# Patient Record
Sex: Female | Born: 1995 | Race: White | Hispanic: No | Marital: Single | Smoking: Never smoker
Health system: Southern US, Community
[De-identification: ages and names within clinical notes are randomized; demographics above are authoritative.]

## PROBLEM LIST (undated history)

## (undated) HISTORY — PX: TOTAL HIP ARTHROPLASTY: SHX124

---
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
## 2016-05-13 ENCOUNTER — Encounter (HOSPITAL_COMMUNITY): Payer: Self-pay | Admitting: Emergency Medicine

## 2016-05-13 ENCOUNTER — Ambulatory Visit (HOSPITAL_COMMUNITY)
Admission: EM | Admit: 2016-05-13 | Discharge: 2016-05-13 | Disposition: A | Discharge status: Home / Self Care | Payer: BC Managed Care – PPO

## 2016-05-13 DIAGNOSIS — J069 Acute upper respiratory infection, unspecified: Secondary | ICD-10-CM

## 2016-05-13 DIAGNOSIS — R05 Cough: Secondary | ICD-10-CM | POA: Diagnosis not present

## 2016-05-13 DIAGNOSIS — R110 Nausea: Secondary | ICD-10-CM | POA: Diagnosis not present

## 2016-05-13 DIAGNOSIS — R059 Cough, unspecified: Secondary | ICD-10-CM

## 2016-05-13 MED ORDER — IPRATROPIUM BROMIDE 0.06 % NA SOLN: 2.0000 | Freq: Four times a day (QID) | NASAL | 0 refills | Status: AC

## 2016-05-13 MED ORDER — BENZONATATE 100 MG PO CAPS: 100.0000 mg | ORAL_CAPSULE | Freq: Three times a day (TID) | ORAL | 0 refills | Status: AC

## 2016-05-13 MED ORDER — ONDANSETRON 4 MG PO TBDP: 4.0000 mg | ORAL_TABLET | Freq: Three times a day (TID) | ORAL | 0 refills | Status: AC | PRN

## 2016-05-13 NOTE — ED Provider Notes (Signed)
CSN: [id number]     Arrival date & time 05/13/16  1110 History   None    Chief Complaint  Patient presents with  . URI   (Consider location/radiation/quality/duration/timing/severity/associated sxs/prior Treatment) Patient c/o uri sx's and nausea for 4 days.  She is having cough and nasal drainage.   The history is provided by the patient.  URI  Presenting symptoms: congestion and sore throat   Severity:  Moderate Duration:  4 days Timing:  Constant Progression:  Partially resolved Chronicity:  New Relieved by:  Nothing Worsened by:  Nothing   History reviewed. No pertinent past medical history. No past surgical history on file. History reviewed. No pertinent family history. Social History  Substance Use Topics  . Smoking status: Not on file  . Smokeless tobacco: Not on file  . Alcohol use Not on file   OB History    No data available     Review of Systems  Constitutional: Negative.   HENT: Positive for congestion, postnasal drip and sore throat.   Eyes: Negative.   Respiratory: Negative.   Cardiovascular: Negative.   Gastrointestinal: Negative.   Endocrine: Negative.   Genitourinary: Negative.   Musculoskeletal: Negative.   Allergic/Immunologic: Negative.   Neurological: Negative.   Hematological: Negative.   Psychiatric/Behavioral: Negative.     Allergies  Patient has no known allergies.  Home Medications   Prior to Admission medications   Medication Sig Start Date End Date Taking? Authorizing Provider  Sertraline HCl (ZOLOFT PO) Take by mouth.   Yes Historical Provider, MD  benzonatate (TESSALON) 100 MG capsule Take 1 capsule (100 mg total) by mouth every 8 (eight) hours. 05/13/16   Deatra Canter, FNP  ipratropium (ATROVENT) 0.06 % nasal spray Place 2 sprays into both nostrils 4 (four) times daily. 05/13/16   Deatra Canter, FNP  ondansetron (ZOFRAN ODT) 4 MG disintegrating tablet Take 1 tablet (4 mg total) by mouth every 8 (eight) hours as needed for  nausea or vomiting. 05/13/16   Deatra Canter, FNP   Meds Ordered and Administered this Visit  Medications - No data to display  BP 111/72 (BP Location: Left Arm)   Pulse 83   Temp 98.2 F (36.8 C) (Oral)   Resp 16   LMP 04/29/2016   SpO2 99%  No data found.   Physical Exam  Constitutional: She appears well-developed and well-nourished.  HENT:  Head: Normocephalic and atraumatic.  Right Ear: External ear normal.  Left Ear: External ear normal.  Mouth/Throat: Oropharynx is clear and moist.  Eyes: Conjunctivae and EOM are normal. Pupils are equal, round, and reactive to light.  Neck: Normal range of motion. Neck supple.  Cardiovascular: Normal rate and regular rhythm.   Pulmonary/Chest: Effort normal and breath sounds normal.  Abdominal: Soft. Bowel sounds are normal.  Nursing note and vitals reviewed.   Urgent Care Course     Procedures (including critical care time)  Labs Review Labs Reviewed - No data to display  Imaging Review No results found.   Visual Acuity Review  Right Eye Distance:   Left Eye Distance:   Bilateral Distance:    Right Eye Near:   Left Eye Near:    Bilateral Near:         MDM   1. Upper respiratory tract infection, unspecified type   2. Nausea   3. Cough    Atrovent Nasal Spray 0.06% 2 sprays per nostril qid prn Tessalon Perles  Push po fluids, rest, tylenol and motrin  otc prn as directed for fever, arthralgias, and myalgias.  Follow up prn if sx's continue or persist.    Deatra CanterWilliam J Murat Rideout, FNP 05/13/16 1352

## 2016-05-13 NOTE — ED Triage Notes (Signed)
C/o cold sx States she has body ache, chills, nausea and diarrhea States she has some class mates with the flu otc meds taking as tx

## 2016-08-06 DIAGNOSIS — M1990 Unspecified osteoarthritis, unspecified site: Secondary | ICD-10-CM

## 2016-08-06 HISTORY — DX: Unspecified osteoarthritis, unspecified site: M19.90

## 2016-09-06 HISTORY — PX: HIP REPLACEMENT: SHX530A

## 2017-02-10 ENCOUNTER — Encounter (HOSPITAL_COMMUNITY): Payer: Self-pay | Admitting: Emergency Medicine

## 2017-02-10 ENCOUNTER — Ambulatory Visit (HOSPITAL_COMMUNITY)
Admission: EM | Admit: 2017-02-10 | Discharge: 2017-02-10 | Disposition: A | Discharge status: Home / Self Care | Payer: BC Managed Care – PPO | Attending: Emergency Medicine | Admitting: Emergency Medicine

## 2017-02-10 DIAGNOSIS — M7712 Lateral epicondylitis, left elbow: Secondary | ICD-10-CM | POA: Diagnosis not present

## 2017-02-10 DIAGNOSIS — R21 Rash and other nonspecific skin eruption: Secondary | ICD-10-CM | POA: Diagnosis not present

## 2017-02-10 MED ORDER — PREDNISONE 50 MG PO TABS: ORAL_TABLET | ORAL | 0 refills | Status: AC

## 2017-02-10 MED ORDER — ALBUTEROL SULFATE HFA 108 (90 BASE) MCG/ACT IN AERS: 2.0000 | INHALATION_SPRAY | RESPIRATORY_TRACT | 0 refills | Status: AC | PRN

## 2017-02-10 NOTE — Discharge Instructions (Signed)
Apply ice in the left elbow. Limited use for a few days. Uncertain as to what was causing the rash of the palms of your hands. This is the third person of seen in the past 3 days with same symptoms. Is possible that this may be a virus of some sort. If there is worsening or new symptoms may return or follow-up with primary care doctor.

## 2017-02-10 NOTE — ED Triage Notes (Signed)
Pt here for possible allergic reaction with red spots to hands x 3 days; pt sts started after taking augmentin

## 2017-02-10 NOTE — ED Provider Notes (Signed)
[facility]    CSN: [id number] Arrival date & time: 02/10/17  1725     History   Chief Complaint Chief Complaint  Patient presents with  . Allergic Reaction    HPI Jenna Riddle is a 21 y.o. female.   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 She also having pain in the left arm. She previously had some swelling in the arms. This weekend she had a dancing recital. It was worse at that time. She has finished a course of Augmentin a few days ago. There was a presumption that the rash may be due to the Augmentin. No generalized rash. Only the flat macular areas of the palms with some itching.      History reviewed. No pertinent past medical history.  There are no active problems to display for this patient.   Past Surgical History:  Procedure Laterality Date  . TOTAL HIP ARTHROPLASTY      OB History    No data available       Home Medications    Prior to Admission medications   Medication Sig Start Date End Date Taking? Authorizing Provider  albuterol (PROVENTIL HFA;VENTOLIN HFA) 108 (90 Base) MCG/ACT inhaler Inhale 2 puffs every 4 (four) hours as needed into the lungs for wheezing or shortness of breath. 02/10/17   Hayden Rasmussen, NP  benzonatate (TESSALON) 100 MG capsule Take 1 capsule (100 mg total) by mouth every 8 (eight) hours. 05/13/16   Deatra Canter, FNP  ipratropium (ATROVENT) 0.06 % nasal spray Place 2 sprays into both nostrils 4 (four) times daily. 05/13/16   Deatra Canter, FNP  ondansetron (ZOFRAN ODT) 4 MG disintegrating tablet Take 1 tablet (4 mg total) by mouth every 8 (eight) hours as needed for nausea or vomiting. 05/13/16   Deatra Canter, FNP  predniSONE (DELTASONE) 50 MG tablet 1 tab po daily for 6 days. Take with food. 02/10/17   Hayden Rasmussen, NP  Sertraline HCl (ZOLOFT PO) Take by mouth.    [provider]    Family History History reviewed. No pertinent family history.  Social  History Social History   Tobacco Use  . Smoking status: Never Smoker  . Smokeless tobacco: Never Used  Substance Use Topics  . Alcohol use: Yes    Comment: occ  . Drug use: No     Allergies   Patient has no known allergies.   Review of Systems Review of Systems  Constitutional: Negative.  Negative for activity change, chills and fever.  HENT: Negative.   Respiratory: Negative.   Cardiovascular: Negative.   Musculoskeletal:       As per HPI  Skin: Negative for color change, pallor and rash.  Neurological: Negative.   All other systems reviewed and are negative.    Physical Exam Triage Vital Signs ED Triage Vitals [02/10/17 1801]  Enc Vitals Group     BP 107/73     Pulse Rate 79     Resp 18     Temp 98.2 F (36.8 C)     Temp Source Oral     SpO2 99 %     Weight      Height      Head Circumference      Peak Flow      Pain Score 4     Pain Loc      Pain Edu?      Excl. in GC?  No data found.  Updated Vital Signs BP 107/73 (BP Location: Right Arm)   Pulse 79   Temp 98.2 F (36.8 C) (Oral)   Resp 18   SpO2 99%   Visual Acuity Right Eye Distance:   Left Eye Distance:   Bilateral Distance:    Right Eye Near:   Left Eye Near:    Bilateral Near:     Physical Exam  Constitutional: She is oriented to person, place, and time. She appears well-developed and well-nourished. No distress.  HENT:  Head: Normocephalic and atraumatic.  Eyes: EOM are normal.  Neck: Normal range of motion. Neck supple.  Cardiovascular: Normal rate.  Pulmonary/Chest: Effort normal. She has wheezes.  Musculoskeletal:  Direct tenderness over the left epicondyles. This produces pain that radiates into the forearm. Good strength in the wrist. Wrist extension against resistance produces pain in the forearm and lateral elbow. No swelling appreciated. Radial pulse 2+. Capillary refill brisk. Bilateral strength 5 over 5.  Neurological: She is alert and oriented to person, place, and  time. No cranial nerve deficit.  Skin: Skin is warm and dry.  Psychiatric: She has a normal mood and affect.  Nursing note and vitals reviewed.    UC Treatments / Results  Labs (all labs ordered are listed, but only abnormal results are displayed) Labs Reviewed - No data to display  EKG  EKG Interpretation None       Radiology No results found.  Procedures Procedures (including critical care time)  Medications Ordered in UC Medications - No data to display   Initial Impression / Assessment and Plan / UC Course  I have reviewed the triage vital signs and the nursing notes.  Pertinent labs & imaging results that were available during my care of the patient were reviewed by me and considered in my medical decision making (see chart for details).    Apply ice in the left elbow. Limited use for a few days. Uncertain as to what was causing the rash of the palms of your hands. This is the third person of seen in the past 3 days with same symptoms. Is possible that this may be a virus of some sort. If there is worsening or new symptoms may return or follow-up with primary care doctor.    Final Clinical Impressions(s) / UC Diagnoses   Final diagnoses:  Rash  Lateral epicondylitis of left elbow    ED Discharge Orders        Ordered    albuterol (PROVENTIL HFA;VENTOLIN HFA) 108 (90 Base) MCG/ACT inhaler  Every 4 hours PRN     02/10/17 2000    predniSONE (DELTASONE) 50 MG tablet     02/10/17 2000       Controlled Substance Prescriptions [place] Controlled Substance Registry consulted? Not Applicable   Hayden RasmussenMabe, Sunshine Mackowski, NP 02/10/17 2003

## 2017-02-10 NOTE — ED Notes (Signed)
Patient discharged by provider Hayden Rasmussenavid Mabe, PA

## 2017-05-13 ENCOUNTER — Other Ambulatory Visit: Payer: Self-pay | Admitting: Physician Assistant

## 2017-05-13 DIAGNOSIS — R221 Localized swelling, mass and lump, neck: Secondary | ICD-10-CM

## 2017-05-20 ENCOUNTER — Ambulatory Visit
Admission: RE | Admit: 2017-05-20 | Discharge: 2017-05-20 | Disposition: A | Discharge status: Home / Self Care | Payer: BC Managed Care – PPO | Source: Ambulatory Visit | Attending: Physician Assistant | Admitting: Physician Assistant

## 2017-05-20 DIAGNOSIS — R221 Localized swelling, mass and lump, neck: Secondary | ICD-10-CM

## 2017-10-27 LAB — UNMAPPED LAB RESULTS
Basophil # (HT): 0.1 10 3/uL — NL (ref 0.0–0.2)
Basophil % (HT): 0.7 % — NL (ref 0.0–1.8)
Eosinophil # (HT): 0.4 10 3/uL — NL (ref 0.0–0.5)
Eosinophil % (HT): 4.3 % — NL (ref 0.0–7.0)
Hematocrit (HT): 39.2 % — NL (ref 34.0–47.0)
Hemoglobin (HGB) (HT): 13.1 g/dL — NL (ref 11.5–16.0)
Lymphocyte # (HT): 2.6 10 3/uL — NL (ref 0.9–3.8)
Lymphocyte % (HT): 29.4 % — NL (ref 17.0–44.0)
MCHC (HT): 33.4 g/dL — NL (ref 32.0–36.0)
MCV (HT): 90.5 FL — NL (ref 81.0–99.0)
Mean Corpuscular Hemoglobin (MCH) (HT): 30.3 pg — NL (ref 26.0–34.0)
Monocyte # (HT): 0.5 10 3/uL — NL (ref 0.2–1.0)
Monocyte % (HT): 5.8 % — NL (ref 4.0–12.0)
Neutrophil # (HT): 5.3 10 3/uL — NL (ref 1.5–7.7)
Platelets (HT): 455 10 3/uL — ABNORMAL HIGH (ref 140–400)
RBC (HT): 4.33 10 6/uL — NL (ref 3.80–5.20)
RDW (HT): 11.9 % — NL (ref 11.5–15.0)
Seg Neut % (HT): 59.8 % — NL (ref 40.0–75.0)
WBC (HT): 8.9 10 3/uL — NL (ref 4.0–10.8)

## 2017-11-24 LAB — UNMAPPED LAB RESULTS
Basophil # (HT): 0.1 10 3/uL — NL (ref 0.0–0.2)
Basophil % (HT): 0.9 % — NL (ref 0.0–1.8)
Eosinophil # (HT): 0.3 10 3/uL — NL (ref 0.0–0.5)
Eosinophil % (HT): 4.5 % — NL (ref 0.0–7.0)
Hematocrit (HT): 38.9 % — NL (ref 34.0–47.0)
Hemoglobin (HGB) (HT): 13.2 g/dL — NL (ref 11.5–16.0)
Lymphocyte # (HT): 3 10 3/uL — NL (ref 0.9–3.8)
Lymphocyte % (HT): 44.9 % — ABNORMAL HIGH (ref 17.0–44.0)
MCHC (HT): 33.9 g/dL — NL (ref 32.0–36.0)
MCV (HT): 89.2 FL — NL (ref 81.0–99.0)
Mean Corpuscular Hemoglobin (MCH) (HT): 30.3 pg — NL (ref 26.0–34.0)
Monocyte # (HT): 0.4 10 3/uL — NL (ref 0.2–1.0)
Monocyte % (HT): 5.8 % — NL (ref 4.0–12.0)
Neutrophil # (HT): 2.9 10 3/uL — NL (ref 1.5–7.7)
Platelets (HT): 351 10 3/uL — NL (ref 140–400)
RBC (HT): 4.36 10 6/uL — NL (ref 3.80–5.20)
RDW (HT): 12 % — NL (ref 11.5–15.0)
Seg Neut % (HT): 43.9 % — NL (ref 40.0–75.0)
WBC (HT): 6.7 10 3/uL — NL (ref 4.0–10.8)

## 2018-01-14 LAB — UNMAPPED LAB RESULTS
Basophil # (HT): 0.1 10 3/uL — NL (ref 0.0–0.2)
Basophil % (HT): 0.8 % — NL (ref 0.0–1.8)
Eosinophil # (HT): 0.2 10 3/uL — NL (ref 0.0–0.5)
Eosinophil % (HT): 2.5 % — NL (ref 0.0–7.0)
Hematocrit (HT): 41.3 % — NL (ref 34.0–47.0)
Hemoglobin (HGB) (HT): 14 g/dL — NL (ref 11.5–16.0)
Lymphocyte # (HT): 2.6 10 3/uL — NL (ref 0.9–3.8)
Lymphocyte % (HT): 34.9 % — NL (ref 17.0–44.0)
MCHC (HT): 33.9 g/dL — NL (ref 32.0–36.0)
MCV (HT): 89.8 FL — NL (ref 81.0–99.0)
Mean Corpuscular Hemoglobin (MCH) (HT): 30.4 pg — NL (ref 26.0–34.0)
Monocyte # (HT): 0.5 10 3/uL — NL (ref 0.2–1.0)
Monocyte % (HT): 6.6 % — NL (ref 4.0–12.0)
Neutrophil # (HT): 4.2 10 3/uL — NL (ref 1.5–7.7)
Platelets (HT): 392 10 3/uL — NL (ref 140–400)
RBC (HT): 4.6 10 6/uL — NL (ref 3.80–5.20)
RDW (HT): 11.9 % — NL (ref 11.5–15.0)
Seg Neut % (HT): 55.2 % — NL (ref 40.0–75.0)
WBC (HT): 7.6 10 3/uL — NL (ref 4.0–10.8)

## 2018-02-24 ENCOUNTER — Ambulatory Visit: Payer: Self-pay | Admitting: Orthopedic Surgery

## 2018-02-27 ENCOUNTER — Ambulatory Visit
Admission: RE | Admit: 2018-02-27 | Discharge: 2018-02-27 | Disposition: A | Discharge status: Home / Self Care | Payer: BLUE CROSS/BLUE SHIELD | Source: Ambulatory Visit | Attending: Orthopedic Surgery | Admitting: Orthopedic Surgery

## 2018-02-27 ENCOUNTER — Ambulatory Visit: Payer: BLUE CROSS/BLUE SHIELD | Admitting: Orthopedic Surgery

## 2018-02-27 ENCOUNTER — Other Ambulatory Visit: Payer: Self-pay | Admitting: Orthopedic Surgery

## 2018-02-27 VITALS — BP 119/72 | HR 130

## 2018-02-27 DIAGNOSIS — M25551 Pain in right hip: Secondary | ICD-10-CM

## 2018-02-27 DIAGNOSIS — Z96641 Presence of right artificial hip joint: Secondary | ICD-10-CM

## 2018-02-27 NOTE — Patient Instructions (Signed)
Active Release Technique (ART):  http://www.activerelease.com/find-a-provider.asp    Patient's Guide to Arthroscopy: [email or web address]_PATIENT_[email or web address]    Phone: [phone]  Fax: [phone]  Email: [email or web address]  Web site: [email or web address]

## 2018-02-27 NOTE — Progress Notes (Signed)
Diagnosis and Code:     ICD-10-CM ICD-9-CM   1. Pain of right hip joint M25.551 719.45       CPT Code:     Hip   _0  Right      _1  Left        _2  Diagnostic Arthroscopy, 80998        _3  Removal of Loose Bodies, 33825        _4  Chondroplasty, 05397        _5  Synovectomy, 67341        _6  Femoroplasty, 93790        _7  Acetabuloplasty, 24097        _8  Labral Repair, 29916         _9  Trochanteric Bursectomy - open, 27062        _10   Trochanteric Bursectomy- endoscopic 29999        _11   Gluteus Medius  Repair - endoscopic 35329         _12  Gluteus Medius Repair - open, 27110        _13  IT Band Release, 27305        _14  Abductor Tenotomy,  27001        _15  Iliopsoas Lengthening 29999        _16  Sciatic Neurolysis,  64712        _17  Proximal Hamstring Repair,  [id number]         _18  Staged Scope PAO,  xxx        _19  Revision scope,  xxx         _20  Other: 29999     Shoulder     _21  Right      _22  Left        _23  Rotator Cuff Repair Arthroscopy, [id number]    _24  possible             _25  Arthroscopic Debridement,  92426         _26  Subacromial Decompression/Acromioplasty Arthroscopy, [id number]         _27  Distal Clavicle Excision Arthroscopy, 83419         _28  Arthroscopic Bankart Repair, 62229         _29  Labral/SLAP Repair Arthroscopy, [phone]         _30  Synovectomy Arthroscopy, (full: 29821 partial:29822),               _31  Bicep Tenodesis/Tenotomy, 23430      _32  Bicep Tenodesis 11941            _33  Other:       Knee     _34  Right      _35  Left        _36  Knee Arthroscopy, 29870        _37  A'scopic menisectomy vs. repair, 74081        _38  A'scopic chondroplasty, 44818         _39  Synovectomy Arthroscopy, (full: 29876 partial:29875)        _40  Lateral Release Arthroscopy, 56314        _41  Loose Body removal Arthroscopy, 97026        _42  Patella repair, 37858        _43  Quad repair,  [id number]        _44  Other:    Elbow    _45  Right      _46  Left        _47  Distal bicep repair, 85027        _48  Debridement of the Common Extensor,  Open, [id number]         _0  Debridement of the  medial epicondyle, 24359          _1  Other:        Anesthesia: _2  Choice _3  General _4  MAC _5  Brachial Plexus Block    _6  IA  _7  Pre op fem Block  _8  POST op fem Block    _9  Regional    Position: _10  Lateral Decubitus _11  Supine    Admit Type:  _12  ASC  _13  Inpatient/SDA _14  23hr      Length of Procedure:  _15  30 min _16  1 hr  _17  1.5 hr  _18 2 hr    _19  other:      Medical Clearance:   _20  PCP  _21  Cardiology _22  Rheum _23  Other:   _24  Anesthesia consult    Special Equipment:   _25  C-arm     _26  Hand table      _27  Other:          Insurance:  _28  WC  _29  MVA    Biologics:        _30  Angel PRP        _31  Stem Cell ([place])        Therapy Preferences:  _32  [facility]   _33  [facility]     _34  [facility]   _35  [facility]  _36  [facility] [place]   _37  [place]   _38  [place]   _39  [place]   _40  [place]   _41  [facility]    _42  [place]   _43  [place]   _44  [place]     _45  [facility]  _46  [place] [facility]  _47  [facility]    _48  [place]       _49  [place]       _50  [place]   _51  [place]      _52  Other:   _53  Other:             COMMENTS:

## 2018-02-27 NOTE — Provider Consult (Signed)
CONSULTATION    PATIENT:   Leslie Myers, Leslie Myers  DOB:  04-12-1995   CONSULT DATE:  02/27/2018 PCP:     AGE:  22     CHIEF COMPLAINT:  Chronic right hip pain.    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 here today with a complex musculoskeletal history.  She developed onset and progression of hip pain in 2018.  Over the course of 3 to 4 weeks and after physical therapy failed to provide durable symptomatic improvement and a period of relative rest did not resolve her symptoms, she visited with a physician in the [place] area.  Imaging was obtained.  It was at first questionable for a labral pathology.  An attempt was made at a diagnostic intra-articular lidocaine injection which failed.  No specific labral pathology was present.  Although there was a question of whether a posterior labral tear was present.  A followup image-guided intra-articular diagnostic lidocaine injection gave notable improvement with post-injection functional testing.  Surgeon involved was Duwayne Heck.  Dr. Aundria Rud elected to perform right hip arthroscopy on March 19, 2017.  The surgery took about 1 and 1/2 hours.  Intraoperative images were available from that surgery.  Patient started physical therapy about 2 to 3 days following surgery, and she was progressing well for the first few months.  On return to some light modified dance at 6 to 8 weeks she noted increasing pain, and by 3 months was quite debilitated.  She visited with Dr. Wallene Huh at [facility] and the updated imaging showed end-stage arthritis.  No further arthroscopic intervention was felt to be indicated and therefore, she was converted to a total hip arthroplasty within 6 months of her hip arthroscopy.  She was able to return to dance and transition to graduate studies at [employer], but has continued to experience constant right hip pain and snapping ever since.  She is here to today, so that we may provide additional  opinions concerning whether she is a candidate for iliopsoas lengthening after hip arthroplasty.  She has visited with Dr. Sudie Bailey, who felt that her components were well fixed and there was no evidence of arthroplasty-related complications with the exception of psoas irritation.  She did have a guided psoas injection which gave 1 week symptomatic relief.     Details of past medical, surgical history, medications, allergies, social history, review of systems, family history are available per the intake questionnaire, 02/27/2018.    PHYSICAL EXAMINATION:  GENERAL:  Well-appearing woman in no acute distress.  She is alert and oriented x3, with a pleasant mood and affect.  HEENT:  She is normocephalic, atraumatic.  Extraocular muscles are intact.  NECK:  Cervical spine exhibits pain-free mobility.  No upper motor neuron signs, cervical myelopathy.  Negative Spurling's and head compression test.  UPPER EXTREMITIES:  Bilateral upper extremities show full active and passive range of motion.  No objective instability.  Negative apprehension, relocation.  Distal pulses intact both upper and lower extremities.  No abrasions, lacerations, lesions.  Sensation intact C5, T1, L2, S1.  No lymphadenopathy.  Deep tendon reflexes 2+.  ABDOMEN:  Soft, nontender with no rebound tenderness or peritoneal signs.  PELVIS:  Stable to AP and lateral compression.  No pain over the pelvic brim, pubic symphysis, ASIS.  LOWER EXTREMITIES:  Right hip range of motion is 100 degrees of flexion with discomfort and internal rotation is to 20, and external rotation is to 80.  Left hip range of motion is 130 flexion with internal rotation to 80, and external rotation to 90.  Negative FADIR.  Negative FABER.  Negative log roll.  Negative straight leg raise with respect to the left hip.  She has positive FADIR and FABER, positive log roll, and negative straight leg raise.  She has reproducible snapping of iliopsoas tendon provocative maneuvering  on the right hip.  She has a positive Thomas test.  She has a variety of arthroscopy portals in close proximity and a large hypertrophic scar from an anterior approach total hip replacement.    DATA REVIEWED:  Her imaging available for review today documents well fixed components.  X-rays reveal well-fixed components, appropriate version, alignment, and orientation.  No obvious periprosthetic complications.    ASSESSMENT AND PLAN:  A 22 year old longstanding right hip pain with index hip arthroscopy complicated by progressive osteoarthritis and the need for hip arthroplasty with subsequent iliopsoas abrasion.     Leslie Myers has exhausted conservative care, physical therapy, activity modification, rest, and anti-inflammatories to address her symptomatic iliopsoas tendinitis, and had a diagnostic image-guided injection which gave temporary symptomatic relief.  We discussed the biomechanical property to the iliopsoas as a secondary stabilizer and also as a powerful hip flexor.  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  In this particular setting, I believe it is reasonable to consider endoscopic iliopsoas lengthening with debridement.  Risks, benefits, alternatives, and reasonable expectations of surgery were discussed.  Prognosis, rate of rehabilitation and recovery were reviewed.  Questions were invited and answered to her satisfaction.  She will weigh her options and set up surgery at a time of her convenience and I will separately discuss this case with Dr. Caswell CorwinStubbs.             ______________________________  Arvil PersonsBrian D Yacqub Baston, MD    BDG/MODL  DD:  02/27/2018 13:36:00  DT:  02/27/2018 14:46:42  Job #:  [id number]    cc:

## 2018-04-08 DIAGNOSIS — R002 Palpitations: Secondary | ICD-10-CM

## 2018-04-08 HISTORY — DX: Palpitations: R00.2

## 2018-05-04 LAB — UNMAPPED LAB RESULTS
Basophil # (HT): 0 10 3/uL — NL (ref 0.0–0.2)
Basophil % (HT): 0.7 % — NL (ref 0.0–1.8)
Eosinophil # (HT): 0.2 10 3/uL — NL (ref 0.0–0.5)
Eosinophil % (HT): 3.2 % — NL (ref 0.0–7.0)
Hematocrit (HT): 39.6 % — NL (ref 34.0–47.0)
Hemoglobin (HGB) (HT): 13.4 g/dL — NL (ref 11.5–16.0)
Lymphocyte # (HT): 2.2 10 3/uL — NL (ref 0.9–3.8)
Lymphocyte % (HT): 39.8 % — NL (ref 17.0–44.0)
MCHC (HT): 33.8 g/dL — NL (ref 32.0–36.0)
MCV (HT): 89.4 FL — NL (ref 81.0–99.0)
Mean Corpuscular Hemoglobin (MCH) (HT): 30.2 pg — NL (ref 26.0–34.0)
Monocyte # (HT): 0.6 10 3/uL — NL (ref 0.2–1.0)
Monocyte % (HT): 10.3 % — NL (ref 4.0–12.0)
Neutrophil # (HT): 2.6 10 3/uL — NL (ref 1.5–7.7)
Platelets (HT): 335 10 3/uL — NL (ref 140–400)
RBC (HT): 4.43 10 6/uL — NL (ref 3.80–5.20)
RDW (HT): 11.8 % — NL (ref 11.5–15.0)
Seg Neut % (HT): 46 % — NL (ref 40.0–75.0)
WBC (HT): 5.6 10 3/uL — NL (ref 4.0–10.8)

## 2018-05-13 ENCOUNTER — Telehealth: Payer: BLUE CROSS/BLUE SHIELD

## 2018-05-13 DIAGNOSIS — M25559 Pain in unspecified hip: Secondary | ICD-10-CM

## 2018-05-13 NOTE — Telephone Encounter (Signed)
Leslie Myers is asking for a script for PT - Left hip    Last seen in Nov - she would like to schedule at [facility] [phone]

## 2018-09-11 LAB — UNMAPPED LAB RESULTS
Hematocrit (HT): 43.4 % — NL (ref 34.0–47.0)
Hemoglobin (HGB) (HT): 14.6 g/dL — NL (ref 11.5–16.0)
MCHC (HT): 33.6 g/dL — NL (ref 32.0–36.0)
MCV (HT): 89.5 FL — NL (ref 81.0–99.0)
Mean Corpuscular Hemoglobin (MCH) (HT): 30.1 pg — NL (ref 26.0–34.0)
Platelets (HT): 419 10 3/uL — ABNORMAL HIGH (ref 140–400)
RBC (HT): 4.85 10 6/uL — NL (ref 3.80–5.20)
RDW (HT): 12.1 % — NL (ref 11.5–15.0)
WBC (HT): 6.4 10 3/uL — NL (ref 4.0–10.8)

## 2019-02-10 IMAGING — US US THYROID
1 series · 14 of 25 positions shown · non-contrast
Comparison: None.

CLINICAL DATA: Goiter. Right thyroid enlargement and difficulty
swallowing.

EXAM:
THYROID ULTRASOUND
TECHNIQUE: Ultrasound examination of the thyroid gland and adjacent soft
tissues was performed.

[Series 1: us thyroid · 0.05mm/px · 14 of 42 slices shown]
[im 1/42]
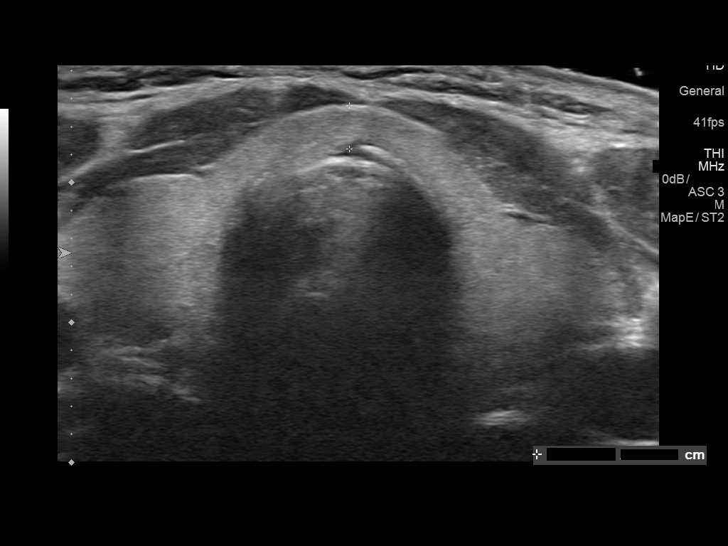
[im 4/42]
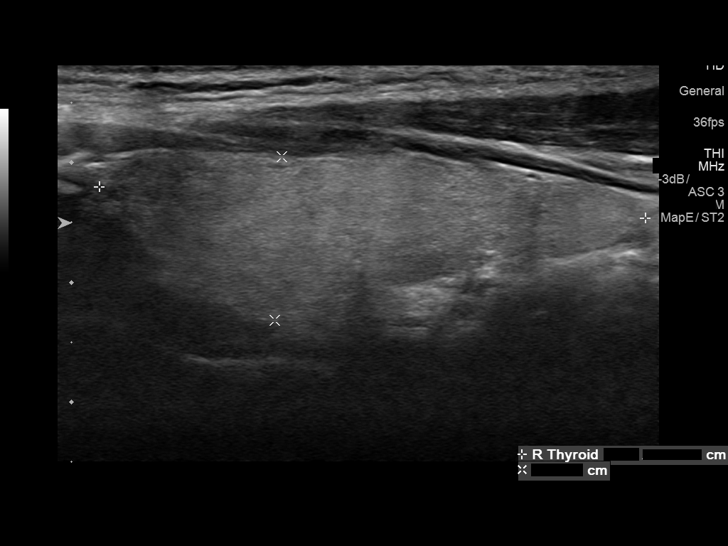
[im 7/42]
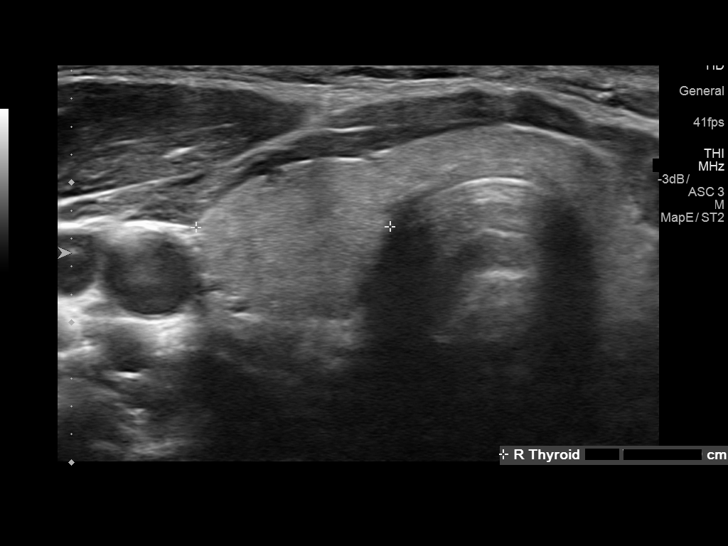
[im 11/42]
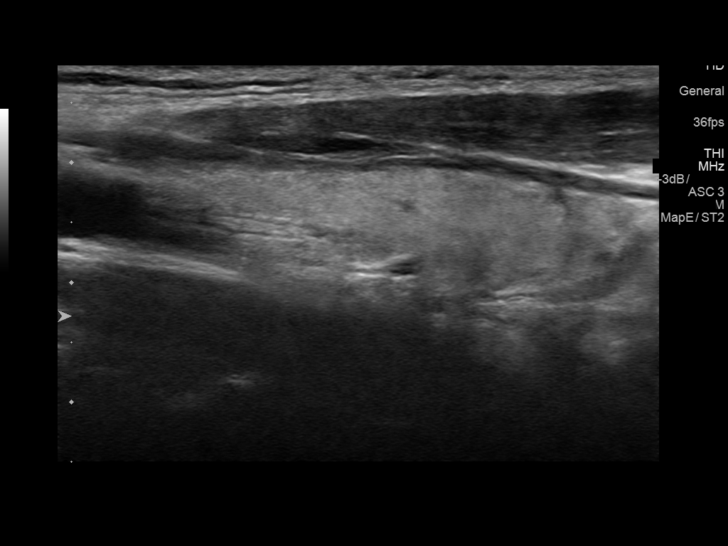
[im 14/42]
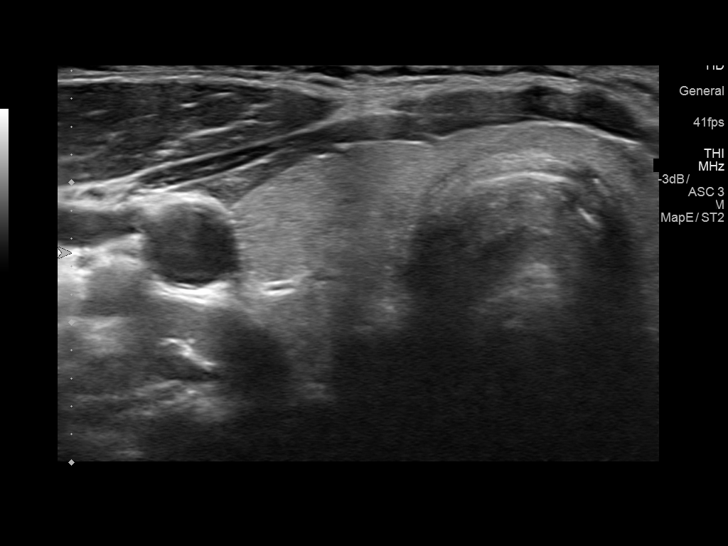
[im 16/42]
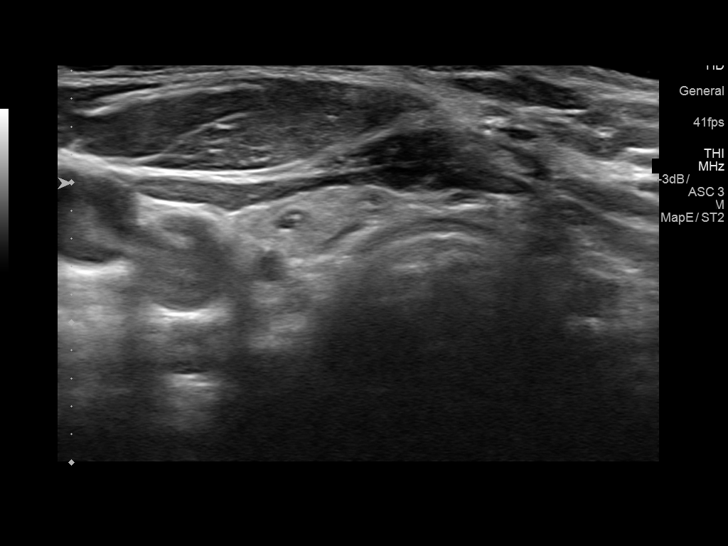
[im 19/42]
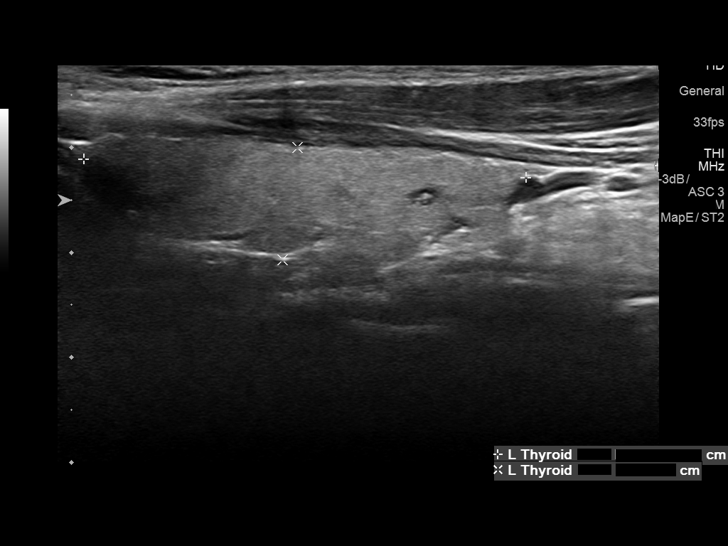
[im 23/42]
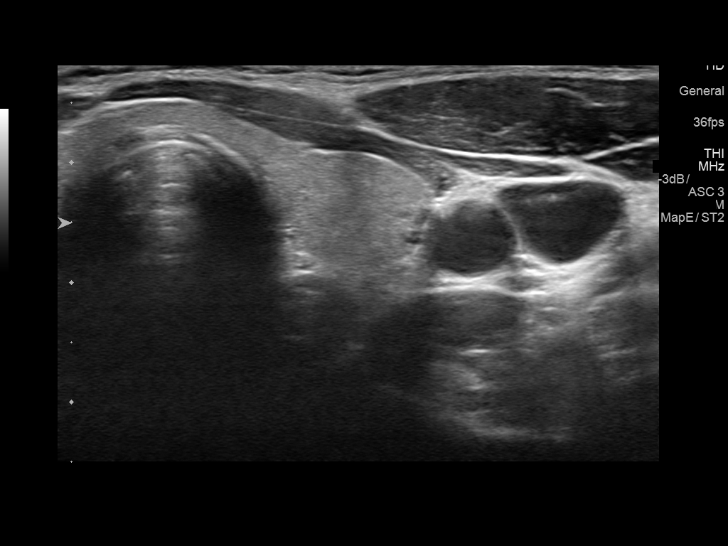
[im 26/42]
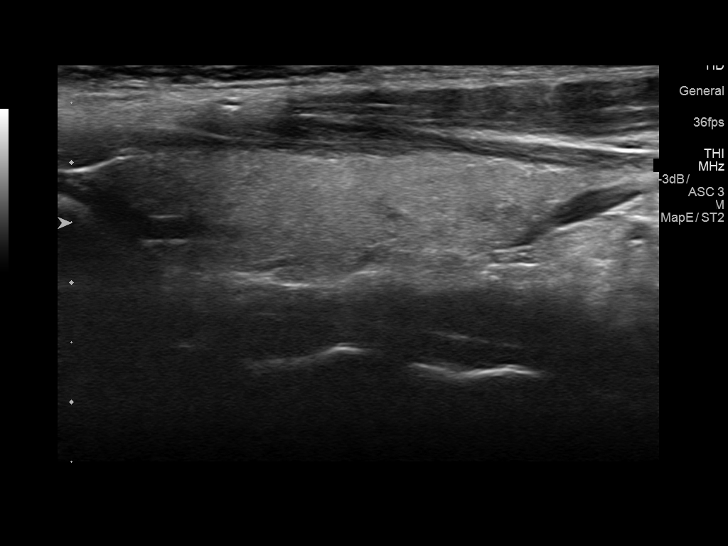
[im 28/42]
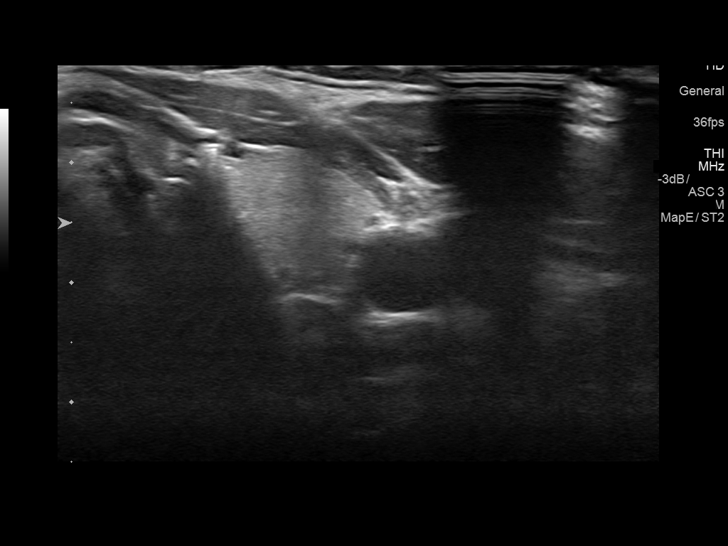
[im 31/42]
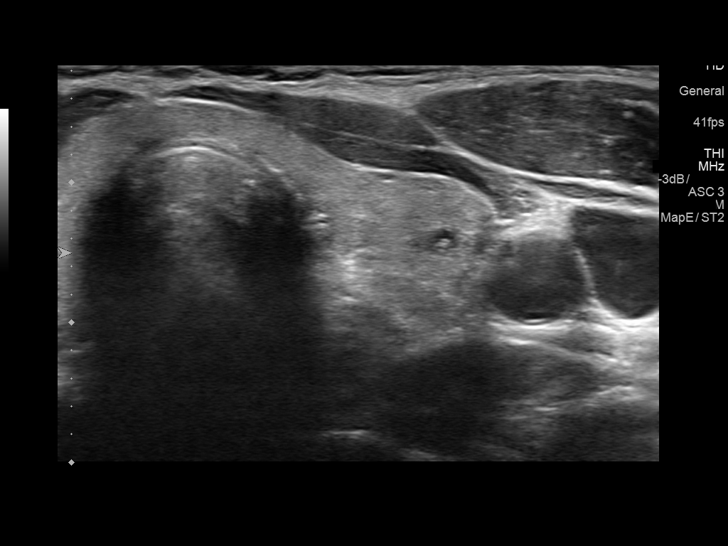
[im 35/42]
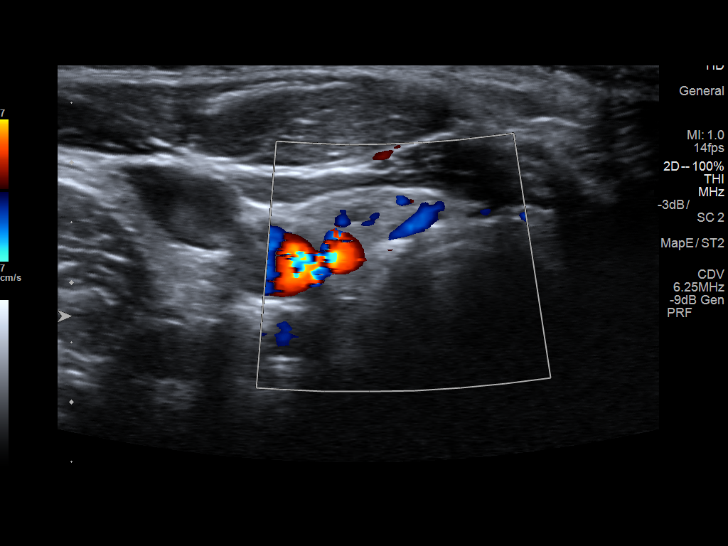
[im 38/42]
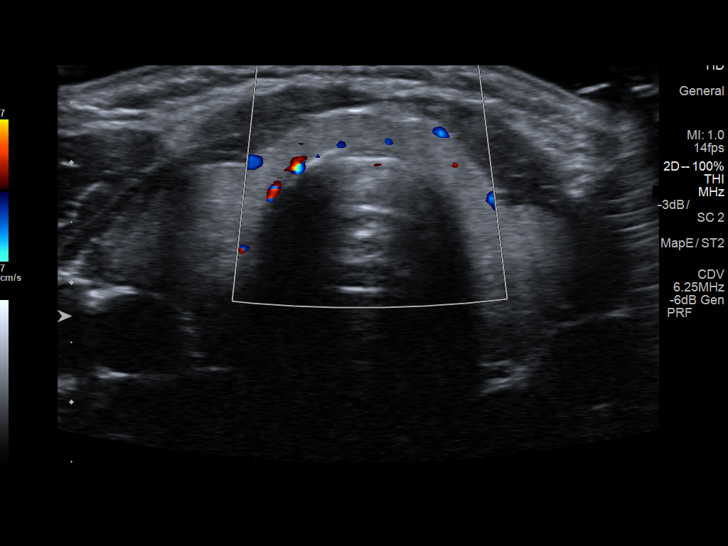
[im 42/42]
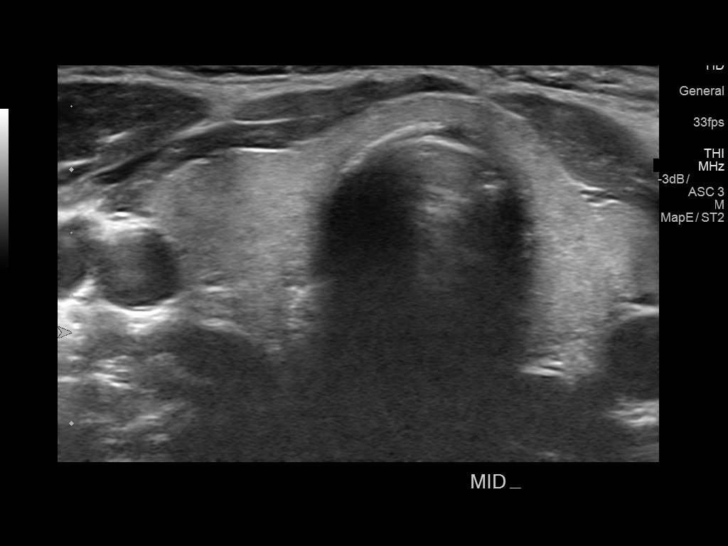

[14 of 25 positions shown; findings below may reference images not displayed]

FINDINGS: Parenchymal Echotexture: Normal

Isthmus: 0.3 cm

Right lobe: 4.6 x 1.4 x 1.4 cm

Left lobe: 4.2 x 1.1 x 1.4 cm

_________________________________________________________

Estimated total number of nodules >/= 1 cm: 0

Number of spongiform nodules >/=  2 cm not described below (TR1): 0

Number of mixed cystic and solid nodules >/= 1.5 cm not described
below (TR2): 0

_________________________________________________________

No discrete nodules are seen within the thyroid gland. 3 mm cyst
present in the inferior left lobe.
IMPRESSION: Normal thyroid ultrasound.

The above is in keeping with the ACR TI-RADS recommendations - [HOSPITAL] 6147;[DATE].

## 2019-06-17 LAB — UNMAPPED LAB RESULTS
Basophil # (HT): 0.1 10 3/uL — NL (ref 0.0–0.2)
Basophil % (HT): 1 % — NL (ref 0–2)
Eosinophil # (HT): 0.3 10 3/uL — NL (ref 0.0–0.5)
Eosinophil % (HT): 5 % — NL (ref 0–6)
Hematocrit (HT): 41 % — NL (ref 34–47)
Hemoglobin (HGB) (HT): 13.8 g/dL — NL (ref 11.5–16.0)
Lymphocyte # (HT): 2.5 10 3/uL — NL (ref 0.9–3.8)
Lymphocyte % (HT): 42 % — NL (ref 21–51)
MCHC (HT): 33.6 g/dL — NL (ref 32.0–36.0)
MCV (HT): 89.7 fL — NL (ref 81.0–99.0)
Mean Corpuscular Hemoglobin (MCH) (HT): 30.1 pg — NL (ref 26.0–34.0)
Monocyte # (HT): 0.4 10 3/uL — NL (ref 0.2–1.0)
Monocyte % (HT): 7 % — NL (ref 4–15)
Neutrophil # (HT): 2.7 10 3/uL — NL (ref 1.5–7.7)
Platelets (HT): 390 10 3/uL — NL (ref 140–400)
RBC (HT): 4.58 10 6/uL — NL (ref 3.80–5.20)
RDW (HT): 12.3 % — NL (ref 11.5–15.0)
Seg Neut % (HT): 45 % — NL (ref 36–66)
WBC (HT): 6 10 3/uL — NL (ref 4.0–10.8)

## 2019-09-10 ENCOUNTER — Other Ambulatory Visit
Admission: RE | Admit: 2019-09-10 | Discharge: 2019-09-10 | Disposition: A | Discharge status: Home / Self Care | Payer: BLUE CROSS/BLUE SHIELD | Source: Ambulatory Visit | Attending: Otolaryngology | Admitting: Otolaryngology

## 2019-09-10 ENCOUNTER — Encounter: Payer: Self-pay | Admitting: Otolaryngology

## 2019-09-10 ENCOUNTER — Ambulatory Visit: Payer: BLUE CROSS/BLUE SHIELD | Admitting: Otolaryngology

## 2019-09-10 VITALS — BP 131/82 | HR 99 | Ht 60.0 in | Wt 132.4 lb

## 2019-09-10 DIAGNOSIS — R5383 Other fatigue: Secondary | ICD-10-CM

## 2019-09-10 DIAGNOSIS — R591 Generalized enlarged lymph nodes: Secondary | ICD-10-CM

## 2019-09-10 LAB — CBC AND DIFFERENTIAL
Baso # K/uL: 0 10*3/uL (ref 0.0–0.1)
Basophil %: 0.5 %
Eos # K/uL: 0.2 10*3/uL (ref 0.0–0.4)
Eosinophil %: 2.4 %
Hematocrit: 42 % (ref 34–45)
Hemoglobin: 13.4 g/dL (ref 11.2–15.7)
IMM Granulocytes #: 0 10*3/uL (ref 0.0–0.0)
IMM Granulocytes: 0.3 %
Lymph # K/uL: 2.6 10*3/uL (ref 1.2–3.7)
Lymphocyte %: 32.7 %
MCH: 30 pg (ref 26–32)
MCHC: 32 g/dL (ref 32–36)
MCV: 92 fL (ref 79–95)
Mono # K/uL: 0.5 10*3/uL (ref 0.2–0.9)
Monocyte %: 6.2 %
Neut # K/uL: 4.6 10*3/uL (ref 1.6–6.1)
Nucl RBC # K/uL: 0 10*3/uL (ref 0.0–0.0)
Nucl RBC %: 0 /100 WBC (ref 0.0–0.2)
Platelets: 377 10*3/uL — ABNORMAL HIGH (ref 160–370)
RBC: 4.5 MIL/uL (ref 3.9–5.2)
RDW: 13 % (ref 11.7–14.4)
Seg Neut %: 57.9 %
WBC: 8 10*3/uL (ref 4.0–10.0)

## 2019-09-10 LAB — SEDIMENTATION RATE, AUTOMATED: Sedimentation Rate: 14 mm/hr (ref 0–20)

## 2019-09-10 NOTE — Progress Notes (Signed)
Chief Complaint: Swollen lymph node     HPI: 24 y.o. female presents with chief complaint of swollen lymph node.  Patient reports that she received an injection for a cyst in her wrist.  She subsequent noticed some swelling of a lymph node in the left side her neck on self-examination.  There is no associated skin discoloration or drainage.  She was seen by PCP and treated with antibiotics with no improvement.  She was then given a "strong "anti-inflammatory medicine on any benefit.  She was then referred for ultrasound.  She has not had any voice or swallowing complaints.  Tolerating regular diet.  Weight has been stable.  She does complain of fatigue and night sweats.  Had PE tubes as a child but no other head neck surgery by report.  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    Asthma 2015    Palpitations 2020       Past Surgical History:   Procedure Laterality Date    HIP REPLACEMENT  June 2018    OTHER SURGICAL HISTORY         Outpatient Medications Marked as Taking for the 09/10/19 encounter (Office Visit) with Haywood Filler, MD   Medication Sig Dispense Refill    amitriptyline (ELAVIL) 25 MG tablet Take 25 mg by mouth nightly      DULoxetine (CYMBALTA) 30 MG DR capsule Take 30 mg by mouth daily      norethindrone-ethinyl estradiol-iron (MICROGESTIN FE1.5/30) 1.5-30 MG-MCG tablet Take 1 tablet by mouth daily       Allergy History as of 09/10/19     AMOXICILLIN-POT CLAVULANATE       Noted Status Severity Type Reaction    02/27/18 1241 Daiva Eves, RN 02/27/18 Active Low Allergy Rash          CONTRAST DYE       Noted Status Severity Type Reaction    02/27/18 1241 Daiva Eves, RN 02/27/18 Active High Allergy Anaphylaxis                Social History     Tobacco  Use    Smoking status: Never Smoker    Smokeless tobacco: Never Used   Substance Use Topics    Alcohol use: Yes     Alcohol/week: 2.0 standard drinks     Types: 2 Standard drinks or equivalent per week     Comment: Occasionally    Drug use: Never     Review of systems positive for fibromyalgia    BP 131/82    Pulse 99    Ht 1.524 m (5')    Wt 60.1 kg (132 lb 6.4 oz)    BMI 25.86 kg/m      She is in no acute distress.  Speech is satisfactory.  The ear canals are free of wax . Tympanic membranes intact and no middle ear effusion is noted. Mastoid is non tender.  Nasal mucosa is intact and free of bleeding sites. No polyps are noted. Septum is not significantly deviated. No purulence is noted. Sinuses are non tender and there is no facial edema/asymetry.  Dentition satisfactory.  Mucosal surfaces intact.  Tonsils grade 2 in size.  No asymmetry or ulceration noted.  There is no  palpable thyroid nodules.  Trachea midline.  In the left zone 5 lower neck region there is a smooth mobile 1 to 1-1/2 cm lymph node anterior to the trapezius.  There is no fixation or other findings.      Ultrasound done through PCP office on May 17.  It shows a 1.2 x 0.9 cm normal-appearing lymph node with normal fatty hilum and shape.  There is also a separate 1.3 x 0.8 mm normal-appearing lymph node.    Impression/Plan: Patient with left-sided lymph node noted on self-examination.  It does not have any worrisome features such as firmness, fixation.  Has complaints of fatigue which certainly can be associated with her fibromyalgia diagnosis.  Recommend blood work including CBC with differential, sed rate, ANA, RF, ACE level.  Patient will call for results.  If this is negative chest x-ray will be ordered to rule out any chest adenopathy or findings to suggest possible sarcoid.  I do not feel surgical intervention is indicated at this point.  Monitor the lymph node with repeat ultrasound in 4 to 6 months or sooner if there is any  changes.    Please feel free to contact me with any questions.  Thank you for allowing me to participate in this patient's care.    Sincerely,    Belinda Block, MD  Chief of Otolaryngology, [facility]  Dictated but not read on [place] for HPI/ROS submitted by the patient on 09/10/2019  Date of onset: : 05/27/2019  Progression since onset:: gradually worsening  What is your goal for today's visit?: Figure out a plan for next steps  How would you rate your pain level? (0 = no pain, 10 = worst pain): 0/10  What prior workup(s) have you had performed?: ultrasound  What treatment(s) have you tried for this concern?: oral antibiotics, oral steroids  Improvement with treatment(s):: no relief

## 2019-09-13 LAB — ANTINUCLEAR ANTIBODY SCREEN: ANA Screen: NEGATIVE

## 2019-09-13 LAB — RHEUMATOID FACTOR,SCREEN: Rheumatoid Factor: <10 IU/mL

## 2019-09-14 ENCOUNTER — Telehealth: Payer: Self-pay | Admitting: Otolaryngology

## 2019-09-14 NOTE — Telephone Encounter (Signed)
message and results given to Dr. Pulli for review

## 2019-09-14 NOTE — Telephone Encounter (Signed)
Pt calling for blood work results. Call [phone]

## 2019-09-15 NOTE — Telephone Encounter (Signed)
Patient calling in again for the results of recent blood work. Pt is requesting a call back as soon as possible.

## 2019-09-15 NOTE — Telephone Encounter (Signed)
Dr. Alberteen Sam has reviewed pt's blood work results and asked that nursing call pt back and let her know that the blood work looks good, he has no additional suggestions, pt should keep ultrasound as scheduled prior to follow up in October. Pt voiced understanding.

## 2019-12-15 LAB — UNMAPPED LAB RESULTS: Platelets (HT): 369 10 3/uL — NL (ref 140–400)

## 2019-12-27 ENCOUNTER — Telehealth: Payer: Self-pay | Admitting: Otolaryngology

## 2019-12-27 NOTE — Progress Notes (Signed)
Called pt with Radiology appt date & time as follows:    PROCEDURE:US soft tissue neck      DATE & TIME:9/24 3pm(patient called to reschedule)      LOCATION:[facility]      Prior Testing/Lab work needed: No      Instructions:  No prep      Prior Auth: N/a      Patient verbalized understanding

## 2019-12-27 NOTE — Telephone Encounter (Signed)
Pt called asking to have [place] order placed at [facility] for a repeat ultrasound, per Dr. Alberteen Sam. She has a FUV with Dr. Alberteen Sam scheduled on 01/11/20. Routing to Aimee to schedule repeat US.

## 2020-01-11 ENCOUNTER — Ambulatory Visit: Payer: BLUE CROSS/BLUE SHIELD | Admitting: Otolaryngology

## 2020-01-11 ENCOUNTER — Encounter: Payer: Self-pay | Admitting: Otolaryngology

## 2020-01-11 VITALS — BP 114/76 | HR 94 | Ht 60.0 in | Wt 140.6 lb

## 2020-01-11 DIAGNOSIS — R591 Generalized enlarged lymph nodes: Secondary | ICD-10-CM

## 2020-01-11 NOTE — Progress Notes (Signed)
Patient was seen in follow-up today for left zone 5 neck lymph node.  Previous evaluation included blood work and ultrasound that was unremarkable.  Comes in for routine follow-up.  Upper respiratory infection in early September.  Covid testing was negative.  Denies any new swallowing or voice issues.  There is been no dramatic change in lymph node although she does feel it can fluctuate somewhat in size.  No overlying skin changes or drainage.  She had some mild discomfort over the area of the last 2 weeks.  No recent antibiotics.    She has had 6 to 8 pound weight gain since her last visit.    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    Outpatient Medications Marked as Taking for the 01/11/20 encounter (Office Visit) with Haywood Filler, MD   Medication Sig Dispense Refill    amitriptyline (ELAVIL) 25 MG tablet Take 25 mg by mouth nightly      DULoxetine (CYMBALTA) 30 MG DR capsule Take 30 mg by mouth daily      norethindrone-ethinyl estradiol-iron (MICROGESTIN FE1.5/30) 1.5-30 MG-MCG tablet Take 1 tablet by mouth daily         BP 114/76    Pulse 94    Ht 1.524 m (5')    Wt 63.8 kg (140 lb 9.6 oz)    BMI 27.46 kg/m     She is in no acute distress.  Speech is excellent.  Oral cavity mucosa intact.  Posterior pharynx symmetric.  There is no submandibular or parotid gland abnormality.  Cranial nerve VII intact.  There is no palpable adenopathy in the right neck.  Left neck has some subtle fullness in the left lower zone 5 area.  There is no overlying skin changes.  Tissue is fleshy and mobile in nature.      Recent ultrasound shows stable small lymph node with normal internal architecture/fatty hilum.    Stable examination.  No new findings or worrisome features.  Do not feel any additional intervention with respect of testing or surgery is indicated.  Warm compresses for some increase sense of discomfort.  Follow-up in 6 months or for significant deterioration.  Features of her exam and lymph nodes  remain consistent with a reactive benign lymph node.  Please feel free to contact me with any questions.  Thank you for allowing me to participate in this patient's care.    Sincerely,      Verlin Grills, MD  Chief of Otolaryngology, [facility]  Dictated but not read on Dragon Software          Answers for HPI/ROS submitted by the patient on 01/07/2020  Date of onset: : 05/10/2019  Progression since onset:: unchanged  What is your goal for today's visit?: Noticed grow and shrink  How would you rate your pain level? (0 = no pain, 10 = worst pain): 0/10  What prior workup(s) have you had performed?: blood work, ultrasound  What treatment(s) have you tried for this concern?: none

## 2020-05-01 LAB — UNMAPPED LAB RESULTS
Hematocrit (HT): 39 % — NL (ref 34–47)
Hemoglobin (HGB) (HT): 13.8 g/dL — NL (ref 11.5–16.0)
MCHC (HT): 35 g/dL — NL (ref 32.0–36.0)
MCV (HT): 88.1 fL — NL (ref 81.0–99.0)
Mean Corpuscular Hemoglobin (MCH) (HT): 30.9 pg — NL (ref 26.0–34.0)
Platelets (HT): 380 10 3/uL — NL (ref 140–400)
RBC (HT): 4.47 10 6/uL — NL (ref 3.80–5.20)
RDW (HT): 12.3 % — NL (ref 11.5–15.0)
WBC (HT): 7.9 10 3/uL — NL (ref 4.0–10.8)

## 2020-07-11 ENCOUNTER — Ambulatory Visit: Payer: BLUE CROSS/BLUE SHIELD | Admitting: Otolaryngology

## 2020-07-26 ENCOUNTER — Encounter: Payer: Self-pay | Admitting: Otolaryngology

## 2020-07-26 ENCOUNTER — Ambulatory Visit: Payer: BLUE CROSS/BLUE SHIELD | Admitting: Otolaryngology

## 2020-07-26 VITALS — BP 107/69 | HR 88 | Ht 60.0 in | Wt 131.8 lb

## 2020-07-26 DIAGNOSIS — R591 Generalized enlarged lymph nodes: Secondary | ICD-10-CM

## 2020-07-26 NOTE — Progress Notes (Signed)
Patient was seen in follow-up today for left neck adenopathy.  As previously noted had ultrasound that showed benign-appearing lymph node.  Comes in for routine surveillance 6 months after previous visit.  Overall health status updated.  She is Scientist, physiological school next month.  She is hoping to get a job in [place].  Overall health status updated.  She was switched from Elavil to gabapentin to see if that was causing any tachycardia issues.  Has follow-up with cardiology next month.    Tolerating regular diet.  No new voice changes or complaints.  She felt her left neck area was slightly larger recently with no went back down in size.  There is no skin drainage or overlying erythema..      Denies any recent hospitalization or surgery.    Outpatient Medications Marked as Taking for the 07/26/20 encounter (Office Visit) with Haywood Filler, MD   Medication Sig Dispense Refill    gabapentin (GABAPENTIN) 300 mg capsule Take 300 mg by mouth nightly      albuterol HFA (PROVENTIL, VENTOLIN, PROAIR HFA) 108 (90 Base) MCG/ACT inhaler Inhale 2 puffs into the lungs every 4 hours as needed      DULoxetine (CYMBALTA) 30 MG DR capsule Take 30 mg by mouth daily      norethindrone-ethinyl estradiol-iron (MICROGESTIN FE1.5/30) 1.5-30 MG-MCG tablet Take 1 tablet by mouth daily         BP 107/69    Pulse 88    Ht 1.524 m (5')    Wt 59.8 kg (131 lb 12.8 oz)    BMI 25.74 kg/m     Patient is in no acute distress.  Speech is excellent.  Oral cavity mucosa intact.  Grade 1/2 tonsils.  Parotid and submandibular glands are unremarkable.  Thyroid is free of any nodularity.  There is no appreciated lymph nodes in the right anterior posterior cervical chain.  There is some subtle fullness in the left lower zone 5 region anterior to the previous.  Mobile.  There is no overlying skin changes.    Stable exam with small-barely palpable-lymph node in zone 5 in the neck.  No worrisome features or changes in behavior.  Patient  reassured.  Follow-up as needed.    Please feel free to contact me with any questions.  Thank you for allowing me to participate in this patient's care.    Sincerely,      Verlin Grills, MD  Chief of Otolaryngology, [facility]  Dictated but not read on Dragon Software          Answers for HPI/ROS submitted by the patient on 07/24/2020  What is your goal for today's visit?: Follow up

## 8039-11-07 DEATH — deceased
